# Patient Record
Sex: Male | Born: 1950 | Race: White | Hispanic: No | Marital: Married | Smoking: Never smoker
Health system: Southern US, Community
[De-identification: ages and names within clinical notes are randomized; demographics above are authoritative.]

---

## 2017-10-06 ENCOUNTER — Emergency Department (HOSPITAL_BASED_OUTPATIENT_CLINIC_OR_DEPARTMENT_OTHER): Payer: Medicare Other

## 2017-10-06 ENCOUNTER — Emergency Department (HOSPITAL_BASED_OUTPATIENT_CLINIC_OR_DEPARTMENT_OTHER)
Admission: EM | Admit: 2017-10-06 | Discharge: 2017-10-07 | Disposition: A | Discharge status: Home / Self Care | Payer: Medicare Other | Attending: Physician Assistant | Admitting: Physician Assistant

## 2017-10-06 ENCOUNTER — Other Ambulatory Visit: Payer: Self-pay

## 2017-10-06 ENCOUNTER — Encounter (HOSPITAL_BASED_OUTPATIENT_CLINIC_OR_DEPARTMENT_OTHER): Payer: Self-pay | Admitting: *Deleted

## 2017-10-06 DIAGNOSIS — E119 Type 2 diabetes mellitus without complications: Secondary | ICD-10-CM | POA: Diagnosis not present

## 2017-10-06 DIAGNOSIS — J111 Influenza due to unidentified influenza virus with other respiratory manifestations: Secondary | ICD-10-CM | POA: Diagnosis not present

## 2017-10-06 DIAGNOSIS — R509 Fever, unspecified: Secondary | ICD-10-CM | POA: Diagnosis present

## 2017-10-06 LAB — CBC WITH DIFFERENTIAL/PLATELET
BASOS ABS: 0 10*3/uL (ref 0.0–0.1)
Basophils Relative: 0 %
Eosinophils Absolute: 0 10*3/uL (ref 0.0–0.7)
Eosinophils Relative: 0 %
HCT: 37.5 % — ABNORMAL LOW (ref 39.0–52.0)
HEMOGLOBIN: 13.2 g/dL (ref 13.0–17.0)
LYMPHS PCT: 6 %
Lymphs Abs: 0.5 10*3/uL — ABNORMAL LOW (ref 0.7–4.0)
MCH: 30.6 pg (ref 26.0–34.0)
MCHC: 35.2 g/dL (ref 30.0–36.0)
MCV: 86.8 fL (ref 78.0–100.0)
Monocytes Absolute: 0.6 10*3/uL (ref 0.1–1.0)
Monocytes Relative: 6 %
NEUTROS ABS: 8.2 10*3/uL — AB (ref 1.7–7.7)
NEUTROS PCT: 88 %
Platelets: 101 10*3/uL — ABNORMAL LOW (ref 150–400)
RBC: 4.32 MIL/uL (ref 4.22–5.81)
RDW: 13.4 % (ref 11.5–15.5)
WBC: 9.4 10*3/uL (ref 4.0–10.5)

## 2017-10-06 LAB — COMPREHENSIVE METABOLIC PANEL
ALBUMIN: 3.6 g/dL (ref 3.5–5.0)
ALK PHOS: 67 U/L (ref 38–126)
ALT: 20 U/L (ref 17–63)
ANION GAP: 10 (ref 5–15)
AST: 24 U/L (ref 15–41)
BUN: 26 mg/dL — ABNORMAL HIGH (ref 6–20)
CO2: 22 mmol/L (ref 22–32)
Calcium: 8.5 mg/dL — ABNORMAL LOW (ref 8.9–10.3)
Chloride: 104 mmol/L (ref 101–111)
Creatinine, Ser: 1.5 mg/dL — ABNORMAL HIGH (ref 0.61–1.24)
GFR calc Af Amer: 54 mL/min — ABNORMAL LOW (ref >60)
GFR calc non Af Amer: 47 mL/min — ABNORMAL LOW (ref >60)
Glucose, Bld: 338 mg/dL — ABNORMAL HIGH (ref 65–99)
POTASSIUM: 3.7 mmol/L (ref 3.5–5.1)
SODIUM: 136 mmol/L (ref 135–145)
Total Bilirubin: 0.9 mg/dL (ref 0.3–1.2)
Total Protein: 6.8 g/dL (ref 6.5–8.1)

## 2017-10-06 LAB — URINALYSIS, ROUTINE W REFLEX MICROSCOPIC
Bilirubin Urine: NEGATIVE
Glucose, UA: >=500 mg/dL — AB
Ketones, ur: 15 mg/dL — AB
LEUKOCYTES UA: NEGATIVE
Nitrite: NEGATIVE
PH: 6 (ref 5.0–8.0)
Protein, ur: 100 mg/dL — AB
Specific Gravity, Urine: >1.03 — ABNORMAL HIGH (ref 1.005–1.030)

## 2017-10-06 LAB — URINALYSIS, MICROSCOPIC (REFLEX)

## 2017-10-06 MED ORDER — OSELTAMIVIR PHOSPHATE 75 MG PO CAPS
75.0000 mg | ORAL_CAPSULE | Freq: Once | ORAL | Status: AC
  Administered 2017-10-07: 75 mg via ORAL
  Filled 2017-10-06: qty 1

## 2017-10-06 MED ORDER — OSELTAMIVIR PHOSPHATE 75 MG PO CAPS: 75.0000 mg | ORAL_CAPSULE | Freq: Two times a day (BID) | ORAL | 0 refills | Status: AC

## 2017-10-06 MED ORDER — SODIUM CHLORIDE 0.9 % IV BOLUS (SEPSIS)
1000.0000 mL | Freq: Once | INTRAVENOUS | Status: AC
  Administered 2017-10-06: 1000 mL via INTRAVENOUS

## 2017-10-06 MED ORDER — IBUPROFEN 800 MG PO TABS
800.0000 mg | ORAL_TABLET | Freq: Once | ORAL | Status: AC
  Administered 2017-10-06: 800 mg via ORAL
  Filled 2017-10-06: qty 1

## 2017-10-06 MED ORDER — ACETAMINOPHEN 325 MG PO TABS
975.0000 mg | ORAL_TABLET | Freq: Once | ORAL | Status: AC
  Administered 2017-10-06: 975 mg via ORAL
  Filled 2017-10-06: qty 3

## 2017-10-06 NOTE — ED Provider Notes (Signed)
[facility] EMERGENCY DEPARTMENT Provider Note   CSN: [id number] Arrival date & time: 10/06/17  2128     History   Chief Complaint Chief Complaint  Patient presents with  . Fever    HPI Emitt Maglione is a 67 y.o. male.  HPI   Patient is a 67 year old male presenting with fever acute onset tonight.  Patient is a type 2 diabetic.  Patient reports using his usual state of health until tonight around 6 PM when he all of a sudden felt ill, chills, fever, body aches.  Patient did not receive his flu shot this year.  Patient had no recent urinary symptoms, no nausea no vomiting no diarrhea.    History reviewed. No pertinent past medical history.  There are no active problems to display for this patient.   History reviewed. No pertinent surgical history.     Home Medications    Prior to Admission medications   Not on File    Family History History reviewed. No pertinent family history.  Social History Social History   Tobacco Use  . Smoking status: Never Smoker  . Smokeless tobacco: Never Used  Substance Use Topics  . Alcohol use: Yes    Frequency: Never    Comment: social  . Drug use: No     Allergies   Patient has no known allergies.   Review of Systems Review of Systems  Constitutional: Positive for fatigue and fever. Negative for activity change.  Respiratory: Negative for shortness of breath.   Cardiovascular: Negative for chest pain.  Gastrointestinal: Negative for abdominal pain.  Musculoskeletal: Positive for myalgias.  All other systems reviewed and are negative.    Physical Exam Updated Vital Signs BP (!) 209/90 (BP Location: Left Arm)   Pulse (!) 113   Temp (!) 102.9 F (39.4 C) (Oral)   Resp (!) 22   Ht 5\' 11"  (1.803 m)   Wt 106.6 kg (235 lb)   SpO2 98%   BMI 32.78 kg/m   Physical Exam  Constitutional: He is oriented to person, place, and time. He appears well-nourished.  HENT:  Head: Normocephalic.  Eyes:  Conjunctivae are normal. Right eye exhibits no discharge. Left eye exhibits no discharge.  Neck: Normal range of motion.  Cardiovascular: Normal rate and regular rhythm.  No murmur heard. Pulmonary/Chest: Effort normal and breath sounds normal. No respiratory distress.  Abdominal: Soft. He exhibits no distension. There is no tenderness.  Neurological: He is oriented to person, place, and time.  Skin: Skin is warm and dry. He is not diaphoretic.  Psychiatric: He has a normal mood and affect. His behavior is normal.     ED Treatments / Results  Labs (all labs ordered are listed, but only abnormal results are displayed) Labs Reviewed  CULTURE, BLOOD (ROUTINE X 2)  CULTURE, BLOOD (ROUTINE X 2)  CBC WITH DIFFERENTIAL/PLATELET  COMPREHENSIVE METABOLIC PANEL  URINALYSIS, ROUTINE W REFLEX MICROSCOPIC  INFLUENZA PANEL BY PCR (TYPE A & B)    EKG  EKG Interpretation None       Radiology No results found.  Procedures Procedures (including critical care time)  Medications Ordered in ED Medications  acetaminophen (TYLENOL) tablet 975 mg (not administered)  sodium chloride 0.9 % bolus 1,000 mL (not administered)     Initial Impression / Assessment and Plan / ED Course  I have reviewed the triage vital signs and the nursing notes.  Pertinent labs & imaging results that were available during my care of the patient were  reviewed by me and considered in my medical decision making (see chart for details).      Patient is a 67 year old male presenting with fever acute onset tonight.  Patient is a type 2 diabetic.  Patient reports using his usual state of health until tonight around 6 PM when he all of a sudden felt ill, chills, fever, body aches.  Patient did not receive his flu shot this year.  Patient had no recent urinary symptoms, no nausea no vomiting no diarrhea.   10:57 PM Patient does not have any focal symptoms.  Doubt urinary tract infection, pneumonia, or other focal  infection.  This really sounds flulike in nature.  Patient symptoms are consistent with flu.  Will make sure that patient's fever decreased with ibuprofen and Tylenol.  Blood culture sent but anticipate them being negative.  If no source of infection will diagnosed with flu, give Tamiflu, and have patient follow-up with primary care.  Final Clinical Impressions(s) / ED Diagnoses   Final diagnoses:  None    ED Discharge Orders    None       Abelino DerrickMackuen, Adora Yeh Lyn, MD 10/07/17 2258

## 2017-10-06 NOTE — ED Notes (Signed)
EDP into room, prior to RN assessment, see MD notes, pending orders.   

## 2017-10-06 NOTE — Discharge Instructions (Signed)
Think you likely have a flu.  Please take this medication help shorten the course of it.  Please return with any focal symptoms, inability to keep your fever down, shortness of breath, or other concerns.

## 2017-10-06 NOTE — ED Triage Notes (Signed)
Fever, tremors CBG >300 tonight. Denies cough, denies N/V.

## 2017-10-06 NOTE — ED Notes (Signed)
Remains alert, NAD, calm, interactive, resps e/u, speaking in clear complete sentences, no dyspnea noted, skin W&D, VSS, fever present, c/o weakness and body aches, (denies: sob, NVD). [facility]  Pt to xray by stretcher

## 2017-10-07 LAB — INFLUENZA PANEL BY PCR (TYPE A & B)
INFLAPCR: NEGATIVE
INFLBPCR: NEGATIVE

## 2017-10-08 NOTE — ED Notes (Signed)
Pt. Called for results of Influenza, Results reviewed, all questions answered. 10/08/2017

## 2017-10-12 LAB — CULTURE, BLOOD (ROUTINE X 2)
CULTURE: NO GROWTH
CULTURE: NO GROWTH
Special Requests: ADEQUATE
Special Requests: ADEQUATE

## 2019-04-11 IMAGING — DX DG CHEST 2V
2 series · 2 of 2 positions shown · non-contrast
Comparison: None.

CLINICAL DATA: High fever with weakness and body ache

EXAM:
CHEST  2 VIEW

[chest pa]
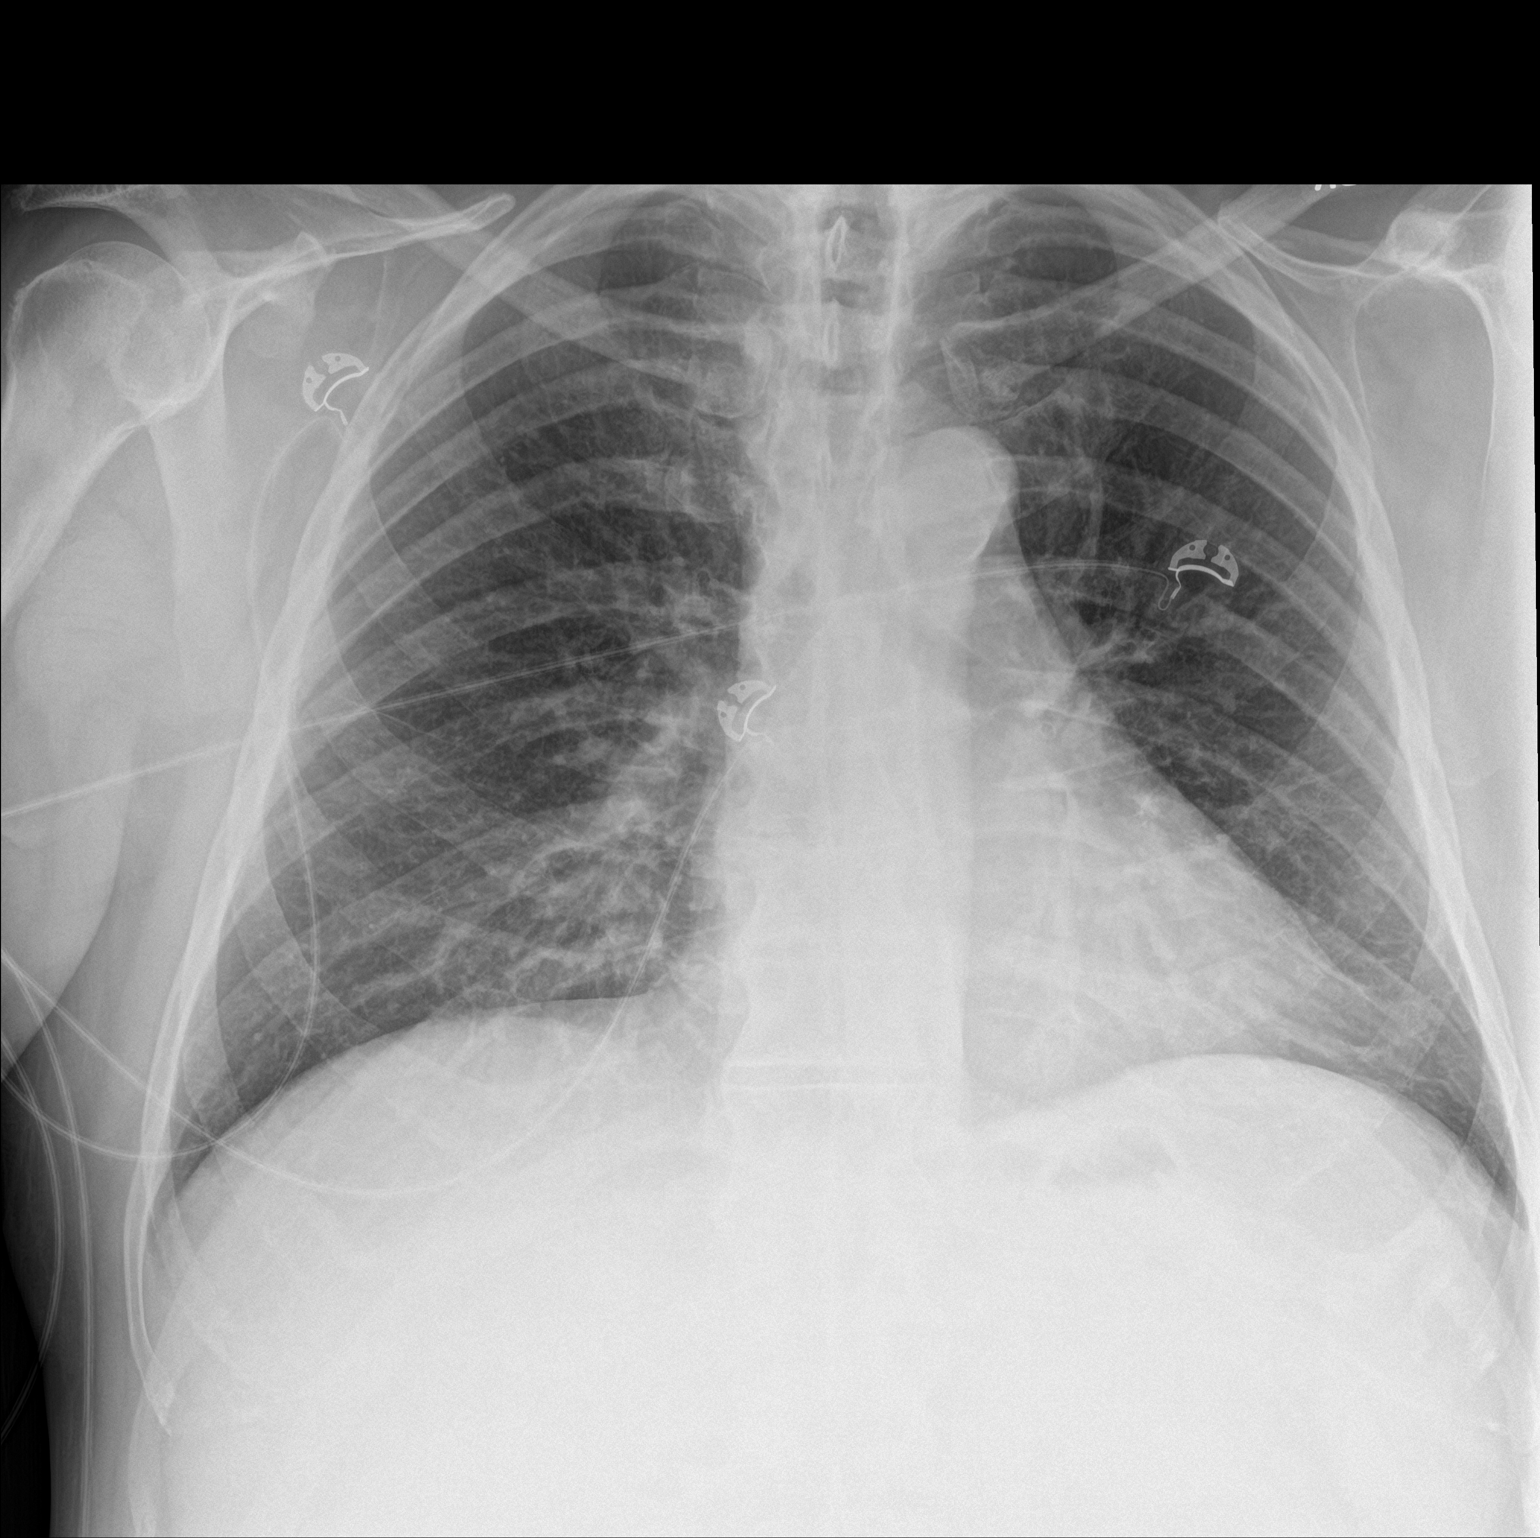

[chest lat]
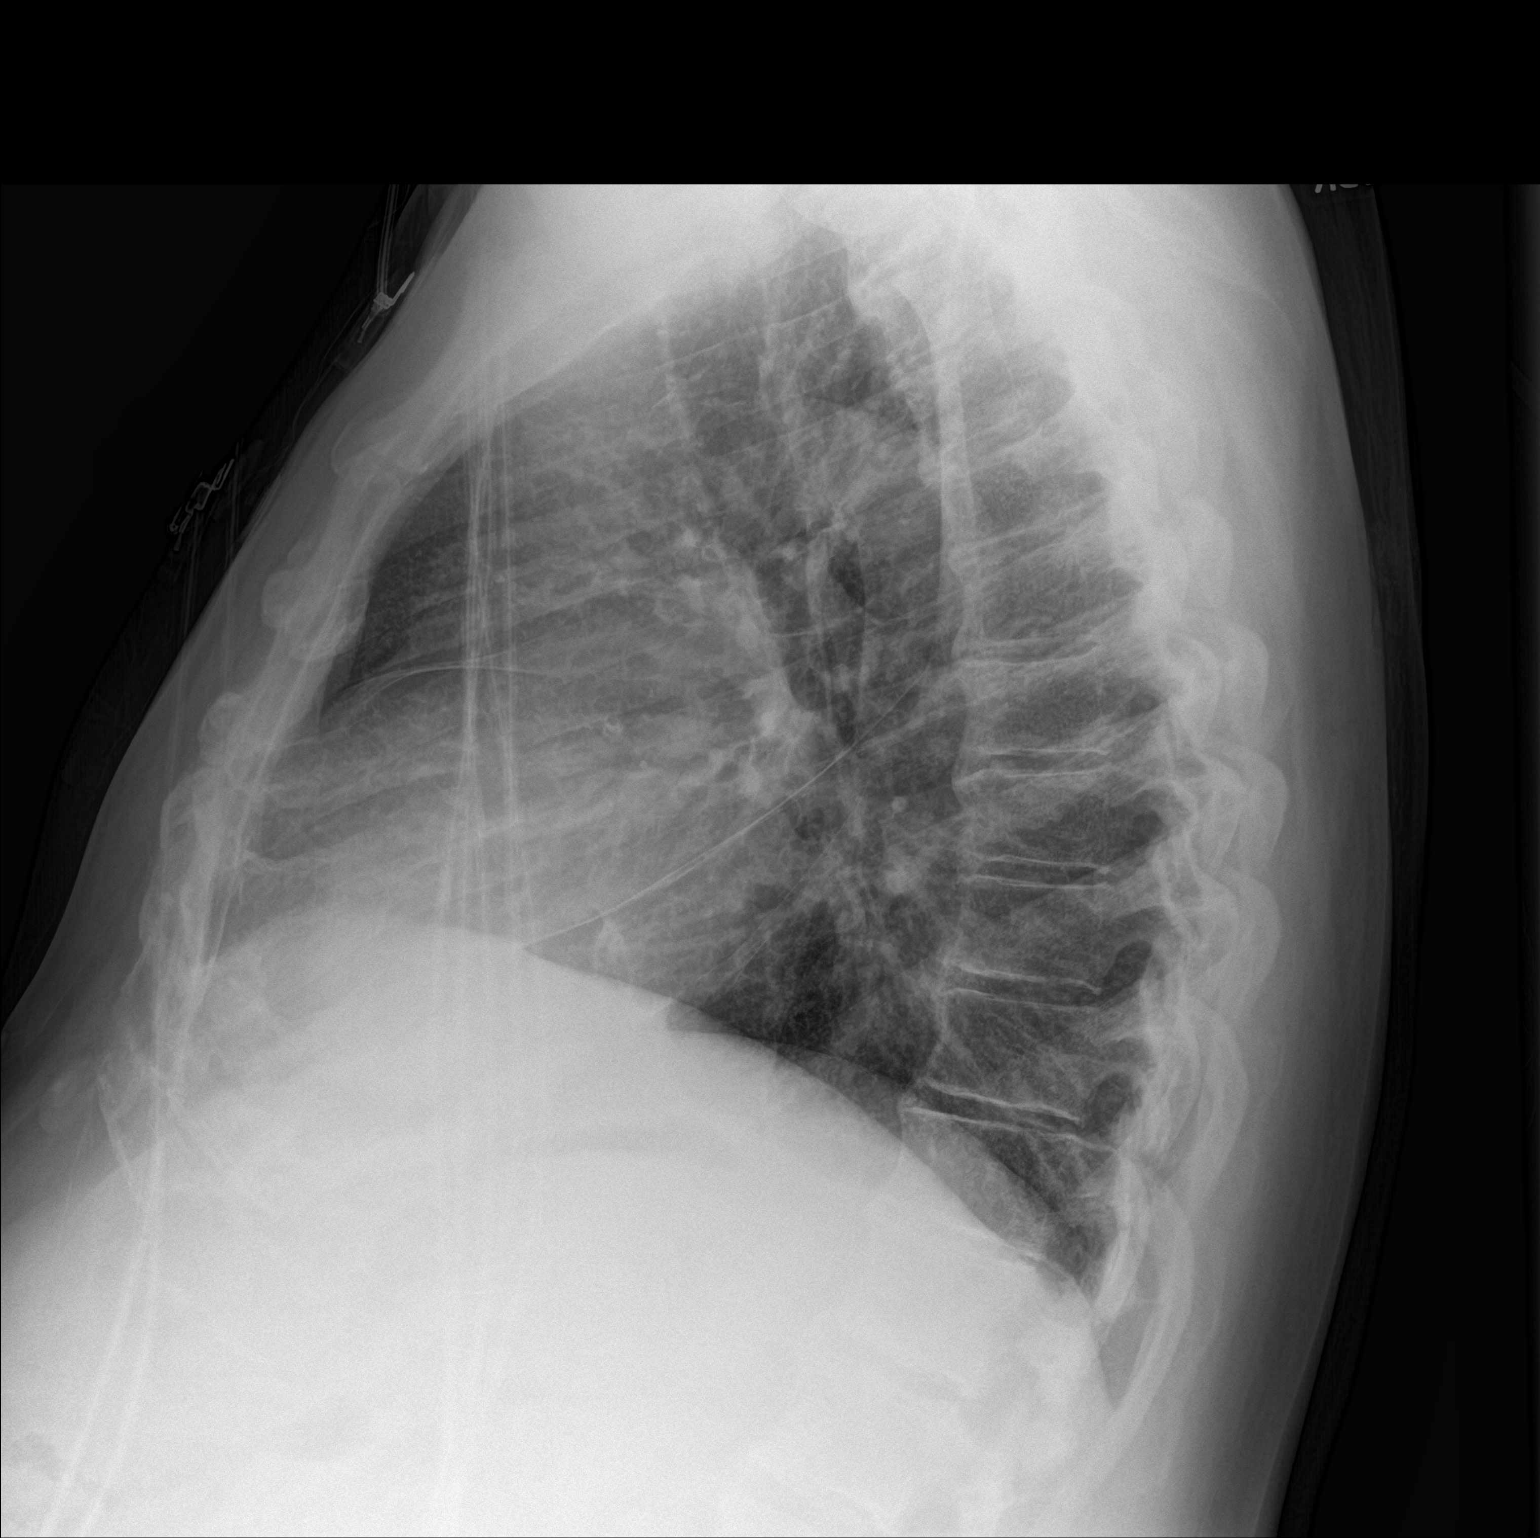

[2 of 2 positions shown; findings below may reference images not displayed]

FINDINGS: Mild bronchitic changes. No consolidation or effusion. Normal heart
size. Aortic atherosclerosis. No pneumothorax. Degenerative changes
of the spine.
IMPRESSION: Mild bronchitic changes.  No focal pulmonary infiltrate.
